# Patient Record
Sex: Male | Born: 2005
Health system: Southern US, Community
[De-identification: ages and names within clinical notes are randomized; demographics above are authoritative.]

---
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
## 2023-01-23 DIAGNOSIS — I4891 Unspecified atrial fibrillation: Secondary | ICD-10-CM | POA: Diagnosis not present

## 2023-01-23 DIAGNOSIS — I10 Essential (primary) hypertension: Secondary | ICD-10-CM | POA: Diagnosis not present

## 2023-07-27 DIAGNOSIS — I10 Essential (primary) hypertension: Secondary | ICD-10-CM | POA: Diagnosis not present

## 2023-11-11 DIAGNOSIS — I480 Paroxysmal atrial fibrillation: Secondary | ICD-10-CM | POA: Diagnosis not present

## 2024-02-17 DIAGNOSIS — L7211 Pilar cyst: Secondary | ICD-10-CM | POA: Diagnosis not present

## 2024-08-25 DIAGNOSIS — L7212 Trichodermal cyst: Secondary | ICD-10-CM | POA: Diagnosis not present
